# Patient Record
Sex: Male | Born: 1985 | Race: Black or African American | Hispanic: No | Marital: Single | Smoking: Never smoker
Health system: Southern US, Community
[De-identification: ages and names within clinical notes are randomized; demographics above are authoritative.]

---

## 2014-04-16 ENCOUNTER — Encounter (HOSPITAL_BASED_OUTPATIENT_CLINIC_OR_DEPARTMENT_OTHER): Payer: Self-pay | Admitting: Emergency Medicine

## 2014-04-16 ENCOUNTER — Inpatient Hospital Stay (HOSPITAL_BASED_OUTPATIENT_CLINIC_OR_DEPARTMENT_OTHER)
Admission: EM | Admit: 2014-04-16 | Discharge: 2014-04-23 | DRG: 558 | Disposition: A | Discharge status: Home / Self Care | Payer: BC Managed Care – PPO | Attending: Internal Medicine | Admitting: Internal Medicine

## 2014-04-16 DIAGNOSIS — R748 Abnormal levels of other serum enzymes: Secondary | ICD-10-CM | POA: Diagnosis present

## 2014-04-16 DIAGNOSIS — M6282 Rhabdomyolysis: Principal | ICD-10-CM | POA: Diagnosis present

## 2014-04-16 DIAGNOSIS — F17200 Nicotine dependence, unspecified, uncomplicated: Secondary | ICD-10-CM | POA: Diagnosis present

## 2014-04-16 DIAGNOSIS — F1910 Other psychoactive substance abuse, uncomplicated: Secondary | ICD-10-CM | POA: Diagnosis present

## 2014-04-16 DIAGNOSIS — M791 Myalgia, unspecified site: Secondary | ICD-10-CM

## 2014-04-16 LAB — CBC WITH DIFFERENTIAL/PLATELET
Basophils Absolute: 0 10*3/uL (ref 0.0–0.1)
Basophils Relative: 0 % (ref 0–1)
Eosinophils Absolute: 0.1 10*3/uL (ref 0.0–0.7)
Eosinophils Relative: 1 % (ref 0–5)
HCT: 45.1 % (ref 39.0–52.0)
Hemoglobin: 15.5 g/dL (ref 13.0–17.0)
LYMPHS ABS: 2.2 10*3/uL (ref 0.7–4.0)
Lymphocytes Relative: 25 % (ref 12–46)
MCH: 28.5 pg (ref 26.0–34.0)
MCHC: 34.4 g/dL (ref 30.0–36.0)
MCV: 82.9 fL (ref 78.0–100.0)
MONOS PCT: 7 % (ref 3–12)
Monocytes Absolute: 0.6 10*3/uL (ref 0.1–1.0)
NEUTROS PCT: 67 % (ref 43–77)
Neutro Abs: 5.8 10*3/uL (ref 1.7–7.7)
Platelets: 218 10*3/uL (ref 150–400)
RBC: 5.44 MIL/uL (ref 4.22–5.81)
RDW: 13.7 % (ref 11.5–15.5)
WBC: 8.6 10*3/uL (ref 4.0–10.5)

## 2014-04-16 LAB — URINALYSIS, ROUTINE W REFLEX MICROSCOPIC
Glucose, UA: NEGATIVE mg/dL
Ketones, ur: 15 mg/dL — AB
NITRITE: NEGATIVE
PROTEIN: 100 mg/dL — AB
SPECIFIC GRAVITY, URINE: 1.021 (ref 1.005–1.030)
Urobilinogen, UA: 1 mg/dL (ref 0.0–1.0)
pH: 6 (ref 5.0–8.0)

## 2014-04-16 LAB — COMPREHENSIVE METABOLIC PANEL
ALK PHOS: 78 U/L (ref 39–117)
ALT: 273 U/L — ABNORMAL HIGH (ref 0–53)
AST: 1193 U/L — ABNORMAL HIGH (ref 0–37)
Albumin: 4.2 g/dL (ref 3.5–5.2)
BILIRUBIN TOTAL: 0.8 mg/dL (ref 0.3–1.2)
BUN: 14 mg/dL (ref 6–23)
CO2: 28 mEq/L (ref 19–32)
CREATININE: 1.1 mg/dL (ref 0.50–1.35)
Calcium: 9.7 mg/dL (ref 8.4–10.5)
Chloride: 101 mEq/L (ref 96–112)
GFR calc non Af Amer: >90 mL/min (ref >90)
GLUCOSE: 92 mg/dL (ref 70–99)
POTASSIUM: 4.1 meq/L (ref 3.7–5.3)
Sodium: 141 mEq/L (ref 137–147)
TOTAL PROTEIN: 7.8 g/dL (ref 6.0–8.3)

## 2014-04-16 LAB — URINE MICROSCOPIC-ADD ON

## 2014-04-16 LAB — CK

## 2014-04-16 MED ORDER — SODIUM CHLORIDE 0.9 % IV BOLUS (SEPSIS)
1000.0000 mL | Freq: Once | INTRAVENOUS | Status: AC
  Administered 2014-04-16: 1000 mL via INTRAVENOUS

## 2014-04-16 MED ORDER — FENTANYL CITRATE 0.05 MG/ML IJ SOLN
50.0000 ug | Freq: Once | INTRAMUSCULAR | Status: AC
  Administered 2014-04-16: 50 ug via INTRAVENOUS
  Filled 2014-04-16: qty 2

## 2014-04-16 MED ORDER — SODIUM CHLORIDE 0.9 % IV SOLN
INTRAVENOUS | Status: DC
  Administered 2014-04-16: 1000 mL via INTRAVENOUS
  Administered 2014-04-20 – 2014-04-22 (×7): via INTRAVENOUS

## 2014-04-16 MED ORDER — HYDROMORPHONE HCL PF 1 MG/ML IJ SOLN
1.0000 mg | Freq: Once | INTRAMUSCULAR | Status: AC
  Administered 2014-04-16: 1 mg via INTRAVENOUS
  Filled 2014-04-16: qty 1

## 2014-04-16 NOTE — Evaluation (Addendum)
Received call about patient with exercise-induced rhabdomyolysis. Patient is otherwise excised nave and decided to do an extremely vigorous upper body workout yesterday. Patient developed some severe body aches as well as some brown urine. Presented to [facility] with a CK level of greater than 50,000, AST of 1200, ALT 273, + hgb/myoglobin in urine. HAs received bolus x 2. Asking for medical admission. No other medical issues. VSS.

## 2014-04-16 NOTE — ED Notes (Signed)
Pt states worked out Monday and having upper chest, shoulders and arm pain,  His complaint is dark urined  Denies flank pain or "internal"pain

## 2014-04-16 NOTE — ED Provider Notes (Signed)
CSN: [id number]     Arrival date & time 04/16/14  2008 History  This chart was scribed for Charles B. Bernette MayersSheldon, MD by Dorothey Basemania Sutton, ED Scribe. This patient was seen in room MH02/MH02 and the patient's care was started at 8:46 PM.    Chief Complaint  Patient presents with  . brown urine    The history is provided by the patient. No language interpreter was used.   HPI Comments: Benjamin Garrett is a 28 y.o. male who presents to the Emergency Department complaining of dark, brown/tea-colored urine with foam/bubbles in it with associated decreased urine volume (only one void today) onset earlier today. He states his last normal void yesterday. He reports myalgias to the abdomen and upper extremities, but states that he exercised these areas heavily 2 days ago. He states that he usually exercises regularly, but his current pain is more persistent and severe than usual. Patient denies dysuria, vomiting, fever, unusual swelling. He denies history of blood pressure problems. He reports that he took creatine (one scoop per bottle) 2 days ago, but denies other medication or supplement use. Patient is a current every day, light smoker and an occasional, social drinker. He denies other illicit drug use. Patient has no other pertinent medical history.  History reviewed. No pertinent past medical history. History reviewed. No pertinent past surgical history. History reviewed. No pertinent family history. History  Substance Use Topics  . Smoking status: Current Every Day Smoker -- 0.50 packs/day    Types: Cigarettes  . Smokeless tobacco: Not on file  . Alcohol Use: No    Review of Systems  A complete 10 system review of systems was obtained and all systems are negative except as noted in the HPI and PMH.    Allergies  Review of patient's allergies indicates no known allergies.  Home Medications   Prior to Admission medications   Not on File   Triage Vitals: BP 142/91  Pulse 88  Temp(Src) 98.3 F  (36.8 C)  Resp 16  Ht 5\' 9"  (1.753 m)  Wt 185 lb (83.915 kg)  BMI 27.31 kg/m2  SpO2 100%  Physical Exam  Nursing note and vitals reviewed. Constitutional: He is oriented to person, place, and time. He appears well-developed and well-nourished.  HENT:  Head: Normocephalic and atraumatic.  Eyes: EOM are normal. Pupils are equal, round, and reactive to light.  Neck: Normal range of motion. Neck supple.  Cardiovascular: Normal rate, normal heart sounds and intact distal pulses.   Pulmonary/Chest: Effort normal and breath sounds normal.  Abdominal: Bowel sounds are normal. He exhibits no distension. There is no tenderness.  Musculoskeletal: Normal range of motion. He exhibits no edema and no tenderness.  Diffuse muscle soreness to upper body.   Neurological: He is alert and oriented to person, place, and time. He has normal strength. No cranial nerve deficit or sensory deficit.  Skin: Skin is warm and dry. No rash noted.  Psychiatric: He has a normal mood and affect.    ED Course  Procedures (including critical care time)  DIAGNOSTIC STUDIES: Oxygen Saturation is 100% on room air, normal by my interpretation.    COORDINATION OF CARE: 8:15 PM- Ordered UA and urine microscopic.   8:52 PM- Discussed that symptoms are due to a problem with the kidneys filtering out myoglobin. Discussed that the patient will likely need to be admitted to the hospital for further care. Ordered CK, serum myoglobin, CBC, and CMP. Ordered IV fluids and fentanyl to manage symptoms. Discussed  treatment plan with patient at bedside and patient verbalized agreement.     Labs Review Labs Reviewed  URINALYSIS, ROUTINE W REFLEX MICROSCOPIC - Abnormal; Notable for the following:    Color, Urine RED (*)    APPearance CLOUDY (*)    Hgb urine dipstick LARGE (*)    Bilirubin Urine MODERATE (*)    Ketones, ur 15 (*)    Protein, ur 100 (*)    Leukocytes, UA SMALL (*)    All other components within normal limits   CK - Abnormal; Notable for the following:    Total CK >50000 (*)    All other components within normal limits  COMPREHENSIVE METABOLIC PANEL - Abnormal; Notable for the following:    AST 1193 (*)    ALT 273 (*)    All other components within normal limits  URINE MICROSCOPIC-ADD ON  CBC WITH DIFFERENTIAL  MYOGLOBIN, SERUM    Imaging Review No results found.   EKG Interpretation None      MDM   Final diagnoses:  Rhabdomyolysis    Pt with labs and symptoms consistent with exercise induced rhabdomyolysis. No signs of renal failure at this time, admit for aggressive hydration.   I personally performed the services described in this documentation, which was scribed in my presence. The recorded information has been reviewed and is accurate.       Charles B. Bernette MayersSheldon, MD 04/16/14 2226

## 2014-04-16 NOTE — ED Notes (Signed)
Pt c/o generalized " achy" with dark brown urine x 1 day

## 2014-04-17 ENCOUNTER — Encounter (HOSPITAL_COMMUNITY): Payer: Self-pay

## 2014-04-17 DIAGNOSIS — M6282 Rhabdomyolysis: Principal | ICD-10-CM

## 2014-04-17 LAB — CBC
HCT: 40.8 % (ref 39.0–52.0)
Hemoglobin: 13.4 g/dL (ref 13.0–17.0)
MCH: 27.6 pg (ref 26.0–34.0)
MCHC: 32.8 g/dL (ref 30.0–36.0)
MCV: 84.1 fL (ref 78.0–100.0)
PLATELETS: 200 10*3/uL (ref 150–400)
RBC: 4.85 MIL/uL (ref 4.22–5.81)
RDW: 14 % (ref 11.5–15.5)
WBC: 6.7 10*3/uL (ref 4.0–10.5)

## 2014-04-17 LAB — CK
Total CK: >50000 U/L — ABNORMAL HIGH (ref 7–232)
Total CK: >50000 U/L — ABNORMAL HIGH (ref 7–232)
Total CK: >50000 U/L — ABNORMAL HIGH (ref 7–232)

## 2014-04-17 LAB — COMPREHENSIVE METABOLIC PANEL
ALT: 264 U/L — ABNORMAL HIGH (ref 0–53)
AST: 1161 U/L — ABNORMAL HIGH (ref 0–37)
Albumin: 3 g/dL — ABNORMAL LOW (ref 3.5–5.2)
Alkaline Phosphatase: 62 U/L (ref 39–117)
BUN: 12 mg/dL (ref 6–23)
CO2: 26 mEq/L (ref 19–32)
Calcium: 8.4 mg/dL (ref 8.4–10.5)
Chloride: 106 mEq/L (ref 96–112)
Creatinine, Ser: 1.01 mg/dL (ref 0.50–1.35)
GFR calc Af Amer: >90 mL/min (ref >90)
GFR calc non Af Amer: >90 mL/min (ref >90)
Glucose, Bld: 112 mg/dL — ABNORMAL HIGH (ref 70–99)
Potassium: 4.1 mEq/L (ref 3.7–5.3)
Sodium: 142 mEq/L (ref 137–147)
Total Bilirubin: 0.5 mg/dL (ref 0.3–1.2)
Total Protein: 5.8 g/dL — ABNORMAL LOW (ref 6.0–8.3)

## 2014-04-17 LAB — MYOGLOBIN, SERUM: Myoglobin: >20000 ng/mL — ABNORMAL HIGH (ref <111)

## 2014-04-17 MED ORDER — OXYCODONE-ACETAMINOPHEN 5-325 MG PO TABS
1.0000 | ORAL_TABLET | ORAL | Status: DC | PRN
  Administered 2014-04-17: 1 via ORAL
  Filled 2014-04-17: qty 1

## 2014-04-17 MED ORDER — OXYCODONE-ACETAMINOPHEN 5-325 MG PO TABS
2.0000 | ORAL_TABLET | Freq: Once | ORAL | Status: AC
  Administered 2014-04-17: 2 via ORAL
  Filled 2014-04-17: qty 2

## 2014-04-17 MED ORDER — HEPARIN SODIUM (PORCINE) 5000 UNIT/ML IJ SOLN
5000.0000 [IU] | Freq: Three times a day (TID) | INTRAMUSCULAR | Status: DC
  Administered 2014-04-17 – 2014-04-22 (×15): 5000 [IU] via SUBCUTANEOUS
  Filled 2014-04-17 (×23): qty 1

## 2014-04-17 MED ORDER — SODIUM CHLORIDE 0.9 % IV SOLN
INTRAVENOUS | Status: DC
  Administered 2014-04-17 – 2014-04-23 (×20): via INTRAVENOUS

## 2014-04-17 MED ORDER — OXYCODONE HCL 5 MG PO TABS
5.0000 mg | ORAL_TABLET | ORAL | Status: DC | PRN
  Administered 2014-04-17 (×3): 5 mg via ORAL
  Filled 2014-04-17 (×3): qty 1

## 2014-04-17 MED ORDER — SODIUM CHLORIDE 0.9 % IV SOLN: INTRAVENOUS | Status: AC

## 2014-04-17 MED ORDER — HEPARIN SODIUM (PORCINE) 5000 UNIT/ML IJ SOLN: 5000.0000 [IU] | Freq: Three times a day (TID) | INTRAMUSCULAR | Status: DC

## 2014-04-17 NOTE — Progress Notes (Signed)
UR completed. Patient changed to inpatient- ck >50,000

## 2014-04-17 NOTE — H&P (Addendum)
Hospitalist Admission History and Physical  Patient name: Benjamin Garrett Medical record number: [id number] Date of birth: 1986/06/07 Age: 28 y.o. Gender: male  Primary Care Provider: No PCP Per Patient  Chief Complaint: rhabdomyolysis  History of Present Illness:This is a 28 y.o. year old male with bo significant medical history presenting with exercise induced rhabdomyolysis. Pt states that he has not exercised in over 6 months. States that he did a very strenuous upper body work on Monday. Pt states that he felt otherwise fine on Tuesday with general soreness, but woke up today feeling like "I got hit by a bus". Pt went to work throughout the day. Works a Office managerdesk job. Did not urinate during the day. Pt urinated when he went home and it was dark brown. Pt denies any illicit drug. Use.Has not performed any other strenous activity apart from exercise on Monday.  [facility]k. LFTs also elevated. AST 1193, ALT 273. Cr 1.1. UA with large blood. [facility]   Patient Active Problem List   Diagnosis Date Noted  . Rhabdomyolysis 04/16/2014   Past Medical History: History reviewed. No pertinent past medical history.  Past Surgical History: History reviewed. No pertinent past surgical history.  Social History: History   Social History  . Marital Status: Single    Spouse Name: N/A    Number of Children: N/A  . Years of Education: N/A   Social History Main Topics  . Smoking status: Current Every Day Smoker -- 0.50 packs/day    Types: Cigarettes  . Smokeless tobacco: None  . Alcohol Use: No  . Drug Use: No  . Sexual Activity: No   Other Topics Concern  . None   Social History Narrative  . None    Family History: History reviewed. No pertinent family history.  Allergies: No Known Allergies  Current Facility-Administered Medications  Medication Dose Route Frequency Provider Last Rate Last Dose  . 0.9 %  sodium chloride infusion    Intravenous Continuous Charles B. Bernette MayersSheldon, MD 250 mL/hr at 04/16/14 2252 1,000 mL at 04/16/14 2252  . 0.9 %  sodium chloride infusion   Intravenous STAT Charles B. Bernette MayersSheldon, MD      . 0.9 %  sodium chloride infusion   Intravenous Continuous Doree AlbeeSteven Loralye Loberg, MD      . heparin injection 5,000 Units  5,000 Units Subcutaneous 3 times per day Doree AlbeeSteven Laural Eiland, MD       Review Of Systems: 12 point ROS negative except as noted above in HPI.  Physical Exam: Filed Vitals:   04/17/14 0034  BP: 139/93  Pulse: 61  Temp: 98.5 F (36.9 C)  Resp: 18    General: alert and cooperative HEENT: PERRLA and extra ocular movement intact Heart: S1, S2 normal, no murmur, rub or gallop, regular rate and rhythm Lungs: clear to auscultation, no wheezes or rales and unlabored breathing Abdomen: abdomen is soft without significant tenderness, masses, organomegaly or guarding Extremities: extremities normal, atraumatic, no cyanosis or edema and minimal to mild UE tenderness Skin:no rashes, no ecchymoses Neurology: normal without focal findings, mental status, speech normal, alert and oriented x3, PERLA and reflexes normal and symmetric  Labs and Imaging: Lab Results  Component Value Date/Time   NA 141 04/16/2014  9:14 PM   K 4.1 04/16/2014  9:14 PM   CL 101 04/16/2014  9:14 PM   CO2 28 04/16/2014  9:14 PM   BUN 14 04/16/2014  9:14 PM  CREATININE 1.10 04/16/2014  9:14 PM   GLUCOSE 92 04/16/2014  9:14 PM   Lab Results  Component Value Date   WBC 8.6 04/16/2014   HGB 15.5 04/16/2014   HCT 45.1 04/16/2014   MCV 82.9 04/16/2014   PLT 218 04/16/2014   Urinalysis    Component Value Date/Time   COLORURINE RED* 04/16/2014 2014   APPEARANCEUR CLOUDY* 04/16/2014 2014   LABSPEC 1.021 04/16/2014 2014   PHURINE 6.0 04/16/2014 2014   GLUCOSEU NEGATIVE 04/16/2014 2014   HGBUR LARGE* 04/16/2014 2014   BILIRUBINUR MODERATE* 04/16/2014 2014   KETONESUR 15* 04/16/2014 2014   PROTEINUR 100* 04/16/2014 2014   UROBILINOGEN 1.0 04/16/2014  2014   NITRITE NEGATIVE 04/16/2014 2014   LEUKOCYTESUR SMALL* 04/16/2014 2014       No results found.   Assessment and Plan: Benjamin Sanfilipponthony Nikolai is a 28 y.o. year old male presenting with rhabdomyolysis   Rhabdomyolysis: Aggressive fluid hydration. NS @ 250 ccs/hr. No renal involvement currently. Serial CKs. Recheck UA in 12-24 hours. Trend myoglobinuria. Strict Is and Os.   Transaminitis: Likely secondary to rhabdomyolysis. Will trend. Consider RUQ u/s, hepatits panel,  if LFTs persist despite hydration.   FEN/GI: regular diet.  Prophylaxis: SCDs Disposition: pending further evaluation  Code Status:Full Code        Doree AlbeeSteven Chicquita Mendel MD  Pager: [phone]

## 2014-04-17 NOTE — Progress Notes (Signed)
[facility] PROGRESS NOTE  Benjamin Garrett ZOX:[id number] DOB: 1985/12/15 DOA: 04/16/2014 PCP: No PCP Per Patient  Assessment/Plan: 1. 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 CK remains at >50000 2. Renal function remains unchanged 3. LFT's slowly trending down 4. Cont aggressive IVF for now 5. Follow renal fx and serial CK trends 2. DVT prophylaxis 1. Heparin subQ  Code Status: Full Family Communication: Pt and family in room Disposition Plan: Pending  Consultants:    Procedures:    Antibiotics:   (indicate start date, and stop date if known)  HPI/Subjective: Feels somewhat better today. Arms and shoulder still hurts   Objective: Filed Vitals:   04/16/14 2014 04/16/14 2302 04/17/14 0034 04/17/14 0602  BP: 142/91 134/84 139/93 134/81  Pulse: 88 66 61 65  Temp: 98.3 F (36.8 C) 98.7 F (37.1 C) 98.5 F (36.9 C) 97.4 F (36.3 C)  TempSrc:  Oral Oral   Resp: 16 16 18 18   Height: 5\' 9"  (1.753 m)  5\' 9"  (1.753 m)   Weight: 83.915 kg (185 lb)  84.369 kg (186 lb)   SpO2: 100% 100% 100% 100%   No intake or output data in the 24 hours ending 04/17/14 0818 Filed Weights   04/16/14 2014 04/17/14 0034  Weight: 83.915 kg (185 lb) 84.369 kg (186 lb)    Exam:   General:  Awake, in nad  Cardiovascular: regular, s1, s2  Respiratory: normal resp effort, no wheezing  Abdomen: soft, nondistended  Musculoskeletal: B upper extremity pain on palpation  Data Reviewed: Basic Metabolic Panel:  Recent Labs Lab 04/16/14 2114 04/17/14 0429  NA 141 142  K 4.1 4.1  CL 101 106  CO2 28 26  GLUCOSE 92 112*  BUN 14 12  CREATININE 1.10 1.01  CALCIUM 9.7 8.4   Liver Function Tests:  Recent Labs Lab 04/16/14 2114 04/17/14 0429  AST 1193* 1161*  ALT 273* 264*  ALKPHOS 78 62  BILITOT 0.8 0.5  PROT 7.8 5.8*  ALBUMIN 4.2 3.0*   No results found for this basename: LIPASE, AMYLASE,  in the last 168 hours No results found for this  basename: AMMONIA,  in the last 168 hours CBC:  Recent Labs Lab 04/16/14 2114 04/17/14 0429  WBC 8.6 6.7  NEUTROABS 5.8  --   HGB 15.5 13.4  HCT 45.1 40.8  MCV 82.9 84.1  PLT 218 200   Cardiac Enzymes:  Recent Labs Lab 04/16/14 2114 04/17/14 0429  CKTOTAL >50000* >50000*   BNP (last 3 results) No results found for this basename: PROBNP,  in the last 8760 hours CBG: No results found for this basename: GLUCAP,  in the last 168 hours  No results found for this or any previous visit (from the past 240 hour(s)).   Studies: No results found.  Scheduled Meds: . sodium chloride   Intravenous STAT   Continuous Infusions: . sodium chloride 1,000 mL (04/16/14 2252)  . sodium chloride 250 mL/hr at 04/17/14 0240    Active Problems:   Rhabdomyolysis  Time spent: 35min  Jerald KiefStephen K Antha Niday  [facility] Pager [phone]. If 7PM-7AM, please contact night-coverage at [email or web address], password [email or web address] 04/17/2014, 8:18 AM  LOS: 1 day

## 2014-04-18 DIAGNOSIS — IMO0001 Reserved for inherently not codable concepts without codable children: Secondary | ICD-10-CM | POA: Diagnosis not present

## 2014-04-18 DIAGNOSIS — R748 Abnormal levels of other serum enzymes: Secondary | ICD-10-CM | POA: Diagnosis not present

## 2014-04-18 DIAGNOSIS — M6282 Rhabdomyolysis: Secondary | ICD-10-CM | POA: Diagnosis not present

## 2014-04-18 LAB — URINE MICROSCOPIC-ADD ON

## 2014-04-18 LAB — CK
Total CK: >50000 U/L — ABNORMAL HIGH (ref 7–232)
Total CK: >50000 U/L — ABNORMAL HIGH (ref 7–232)
Total CK: >50000 U/L — ABNORMAL HIGH (ref 7–232)

## 2014-04-18 LAB — COMPREHENSIVE METABOLIC PANEL
ALK PHOS: 66 U/L (ref 39–117)
ALT: 424 U/L — AB (ref 0–53)
AST: 1715 U/L — ABNORMAL HIGH (ref 0–37)
Albumin: 3.1 g/dL — ABNORMAL LOW (ref 3.5–5.2)
BUN: 7 mg/dL (ref 6–23)
CHLORIDE: 102 meq/L (ref 96–112)
CO2: 28 meq/L (ref 19–32)
Calcium: 8.4 mg/dL (ref 8.4–10.5)
Creatinine, Ser: 0.99 mg/dL (ref 0.50–1.35)
GLUCOSE: 87 mg/dL (ref 70–99)
POTASSIUM: 3.9 meq/L (ref 3.7–5.3)
SODIUM: 138 meq/L (ref 137–147)
Total Bilirubin: 0.6 mg/dL (ref 0.3–1.2)
Total Protein: 6.2 g/dL (ref 6.0–8.3)

## 2014-04-18 LAB — URINALYSIS, ROUTINE W REFLEX MICROSCOPIC
Bilirubin Urine: NEGATIVE
GLUCOSE, UA: NEGATIVE mg/dL
Ketones, ur: 15 mg/dL — AB
LEUKOCYTES UA: NEGATIVE
Nitrite: NEGATIVE
PH: 6.5 (ref 5.0–8.0)
Protein, ur: 30 mg/dL — AB
SPECIFIC GRAVITY, URINE: 1.008 (ref 1.005–1.030)
Urobilinogen, UA: 0.2 mg/dL (ref 0.0–1.0)

## 2014-04-18 MED ORDER — METHOCARBAMOL 500 MG PO TABS
500.0000 mg | ORAL_TABLET | Freq: Four times a day (QID) | ORAL | Status: DC | PRN
  Administered 2014-04-18 – 2014-04-21 (×4): 500 mg via ORAL
  Filled 2014-04-18 (×5): qty 1

## 2014-04-18 MED ORDER — HYDROMORPHONE HCL PF 1 MG/ML IJ SOLN
1.0000 mg | INTRAMUSCULAR | Status: DC | PRN
  Administered 2014-04-18 – 2014-04-22 (×9): 1 mg via INTRAVENOUS
  Filled 2014-04-18 (×12): qty 1

## 2014-04-18 MED ORDER — HYDROCODONE-ACETAMINOPHEN 5-325 MG PO TABS
1.0000 | ORAL_TABLET | ORAL | Status: DC | PRN
  Administered 2014-04-18: 1 via ORAL
  Filled 2014-04-18 (×2): qty 1

## 2014-04-18 MED ORDER — METHOCARBAMOL 500 MG PO TABS
500.0000 mg | ORAL_TABLET | Freq: Once | ORAL | Status: AC
  Administered 2014-04-18: 500 mg via ORAL
  Filled 2014-04-18 (×2): qty 1

## 2014-04-18 NOTE — Progress Notes (Signed)
[facility] PROGRESS NOTE  Benjamin Garrett [id number] DOB: 09/02/1986 DOA: 04/16/2014 PCP: No PCP Per Patient  Assessment/Plan: 1. 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 CK remains at >50000 2. Renal function remains unchanged and normal 3. LFT's trending up this AM - pt did receive acetaminophen containing pain meds, which have now been d/c'd 4. Cont aggressive IVF 5. Follow renal fx and serial CK trends 2. DVT prophylaxis 1. Heparin subQ  Code Status: Full Family Communication: Pt and family in room Disposition Plan: Pending  Consultants:    Procedures:    Antibiotics:    HPI/Subjective: Reports much improved muscle soreness   Objective: Filed Vitals:   04/17/14 0602 04/17/14 1345 04/17/14 2227 04/18/14 0443  BP: 134/81 140/65 148/99 145/88  Pulse: 65 74 64 74  Temp: 97.4 F (36.3 C) 98 F (36.7 C) 98 F (36.7 C) 98.3 F (36.8 C)  TempSrc:  Oral Oral Oral  Resp: 18 18 18 18   Height:      Weight:    84.959 kg (187 lb 4.8 oz)  SpO2: 100% 100% 100% 98%    Intake/Output Summary (Last 24 hours) at 04/18/14 0812 Last data filed at 04/18/14 0700  Gross per 24 hour  Intake 9944.17 ml  Output   5900 ml  Net 4044.17 ml   Filed Weights   04/16/14 2014 04/17/14 0034 04/18/14 0443  Weight: 83.915 kg (185 lb) 84.369 kg (186 lb) 84.959 kg (187 lb 4.8 oz)    Exam:   General:  Awake, in nad  Cardiovascular: regular, s1, s2  Respiratory: normal resp effort, no wheezing  Abdomen: soft, nondistended  Musculoskeletal: B upper extremity pain on palpation  Data Reviewed: Basic Metabolic Panel:  Recent Labs Lab 04/16/14 2114 04/17/14 0429 04/18/14 0035  NA 141 142 138  K 4.1 4.1 3.9  CL 101 106 102  CO2 28 26 28   GLUCOSE 92 112* 87  BUN 14 12 7   CREATININE 1.10 1.01 0.99  CALCIUM 9.7 8.4 8.4   Liver Function Tests:  Recent Labs Lab 04/16/14 2114 04/17/14 0429 04/18/14 0035  AST 1193* 1161* 1715*  ALT  273* 264* 424*  ALKPHOS 78 62 66  BILITOT 0.8 0.5 0.6  PROT 7.8 5.8* 6.2  ALBUMIN 4.2 3.0* 3.1*   No results found for this basename: LIPASE, AMYLASE,  in the last 168 hours No results found for this basename: AMMONIA,  in the last 168 hours CBC:  Recent Labs Lab 04/16/14 2114 04/17/14 0429  WBC 8.6 6.7  NEUTROABS 5.8  --   HGB 15.5 13.4  HCT 45.1 40.8  MCV 82.9 84.1  PLT 218 200   Cardiac Enzymes:  Recent Labs Lab 04/16/14 2114 04/17/14 0429 04/17/14 0951 04/17/14 1708 04/18/14 0035  CKTOTAL >50000* >50000* >50000* >50000* >50000*   BNP (last 3 results) No results found for this basename: PROBNP,  in the last 8760 hours CBG: No results found for this basename: GLUCAP,  in the last 168 hours  No results found for this or any previous visit (from the past 240 hour(s)).   Studies: No results found.  Scheduled Meds: . heparin subcutaneous  5,000 Units Subcutaneous 3 times per day   Continuous Infusions: . sodium chloride 1,000 mL (04/16/14 2252)  . sodium chloride 250 mL/hr at 04/17/14 2335    Active Problems:   Rhabdomyolysis  Time spent: 35min  Benjamin Garrett  [facility] Pager [phone]. If 7PM-7AM, please contact night-coverage at [email or web address], password [email or web address] 04/18/2014, 8:12 AM  LOS: 2 days

## 2014-04-19 ENCOUNTER — Inpatient Hospital Stay (HOSPITAL_COMMUNITY): Payer: BC Managed Care – PPO

## 2014-04-19 DIAGNOSIS — IMO0001 Reserved for inherently not codable concepts without codable children: Secondary | ICD-10-CM | POA: Diagnosis not present

## 2014-04-19 DIAGNOSIS — R748 Abnormal levels of other serum enzymes: Secondary | ICD-10-CM | POA: Diagnosis not present

## 2014-04-19 DIAGNOSIS — M6282 Rhabdomyolysis: Secondary | ICD-10-CM | POA: Diagnosis not present

## 2014-04-19 LAB — COMPREHENSIVE METABOLIC PANEL
ALBUMIN: 3.3 g/dL — AB (ref 3.5–5.2)
ALT: 556 U/L — AB (ref 0–53)
AST: 2145 U/L — ABNORMAL HIGH (ref 0–37)
Alkaline Phosphatase: 69 U/L (ref 39–117)
BILIRUBIN TOTAL: 0.5 mg/dL (ref 0.3–1.2)
BUN: 7 mg/dL (ref 6–23)
CHLORIDE: 102 meq/L (ref 96–112)
CO2: 27 mEq/L (ref 19–32)
Calcium: 8.9 mg/dL (ref 8.4–10.5)
Creatinine, Ser: 0.85 mg/dL (ref 0.50–1.35)
GFR calc Af Amer: >90 mL/min (ref >90)
GFR calc non Af Amer: >90 mL/min (ref >90)
GLUCOSE: 92 mg/dL (ref 70–99)
POTASSIUM: 4.2 meq/L (ref 3.7–5.3)
Sodium: 140 mEq/L (ref 137–147)
TOTAL PROTEIN: 6.8 g/dL (ref 6.0–8.3)

## 2014-04-19 LAB — SEDIMENTATION RATE: Sed Rate: 4 mm/hr (ref 0–16)

## 2014-04-19 LAB — RAPID URINE DRUG SCREEN, HOSP PERFORMED
Amphetamines: NOT DETECTED
Barbiturates: NOT DETECTED
Benzodiazepines: NOT DETECTED
Cocaine: NOT DETECTED
Opiates: NOT DETECTED
Tetrahydrocannabinol: POSITIVE — AB

## 2014-04-19 LAB — LACTIC ACID, PLASMA: Lactic Acid, Venous: 1.1 mmol/L (ref 0.5–2.2)

## 2014-04-19 LAB — LACTATE DEHYDROGENASE: LDH: 3630 U/L — ABNORMAL HIGH (ref 94–250)

## 2014-04-19 LAB — URIC ACID: Uric Acid, Serum: 3.8 mg/dL — ABNORMAL LOW (ref 4.0–7.8)

## 2014-04-19 LAB — CK: Total CK: >50000 U/L — ABNORMAL HIGH (ref 7–232)

## 2014-04-19 MED ORDER — SODIUM CHLORIDE 0.9 % IV BOLUS (SEPSIS)
250.0000 mL | Freq: Once | INTRAVENOUS | Status: AC
  Administered 2014-04-19: 250 mL via INTRAVENOUS

## 2014-04-19 NOTE — Progress Notes (Signed)
[facility] PROGRESS NOTE  Benjamin Garrett [id number]:[id number] DOB: 12-Oct-1986 DOA: 04/16/2014 PCP: No PCP Per Patient  Assessment/Plan: 1. 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 CK remains at >50000 despite aggressive ivf 2. Renal function remains unchanged and normal 3. LFT's continuing to trend up - acetaminophen containing pain meds have now been d/c'd (see below) 4. Cont aggressive IVF 5. Cont to follow renal fx and serial CK trends 6. Given degree of CK, will check urine drug screen 7. Pt did admit to taking pre-workout supplement x 2 days before onset of sx 8. Will check lactate, pyruvate, LDH, ESR, carnitine, ANA to r/o underlying inherited myopathy 2. Elevated Liver Enzymes 1. Suspect secondary to presenting rhabdo 2. Rising despite aggressive fluids 3. Will check liver US 3. DVT prophylaxis 1. Heparin subQ  Code Status: Full Family Communication: Pt in room Disposition Plan: Pending  Consultants:    Procedures:    Antibiotics:    HPI/Subjective: Reports continued improvement   Objective: Filed Vitals:   04/18/14 0443 04/18/14 1525 04/18/14 2107 04/19/14 0610  BP: 145/88 155/90 141/98 136/90  Pulse: 74 71 69 67  Temp: 98.3 F (36.8 C) 98 F (36.7 C) 98.9 F (37.2 C) 97.9 F (36.6 C)  TempSrc: Oral Oral Oral Oral  Resp: '18 19 18 17  ' Height:      Weight: 84.959 kg (187 lb 4.8 oz)   83.416 kg (183 lb 14.4 oz)  SpO2: 98% 99% 100% 100%    Intake/Output Summary (Last 24 hours) at 04/19/14 0847 Last data filed at 04/19/14 8832  Gross per 24 hour  Intake   3825 ml  Output   2350 ml  Net   1475 ml   Filed Weights   04/17/14 0034 04/18/14 0443 04/19/14 0610  Weight: 84.369 kg (186 lb) 84.959 kg (187 lb 4.8 oz) 83.416 kg (183 lb 14.4 oz)    Exam:   General:  Awake, in nad  Cardiovascular: regular, s1, s2  Respiratory: normal resp effort, no wheezing  Abdomen: soft, nondistended  Musculoskeletal: B upper  extremity pain on palpation  Data Reviewed: Basic Metabolic Panel:  Recent Labs Lab 04/16/14 2114 04/17/14 0429 04/18/14 0035 04/19/14 0012  NA 141 142 138 140  K 4.1 4.1 3.9 4.2  CL 101 106 102 102  CO2 '28 26 28 27  ' GLUCOSE 92 112* 87 92  BUN '14 12 7 7  ' CREATININE 1.10 1.01 0.99 0.85  CALCIUM 9.7 8.4 8.4 8.9   Liver Function Tests:  Recent Labs Lab 04/16/14 2114 04/17/14 0429 04/18/14 0035 04/19/14 0012  AST 1193* 1161* 1715* 2145*  ALT 273* 264* 424* 556*  ALKPHOS 78 62 66 69  BILITOT 0.8 0.5 0.6 0.5  PROT 7.8 5.8* 6.2 6.8  ALBUMIN 4.2 3.0* 3.1* 3.3*   No results found for this basename: LIPASE, AMYLASE,  in the last 168 hours No results found for this basename: AMMONIA,  in the last 168 hours CBC:  Recent Labs Lab 04/16/14 2114 04/17/14 0429  WBC 8.6 6.7  NEUTROABS 5.8  --   HGB 15.5 13.4  HCT 45.1 40.8  MCV 82.9 84.1  PLT 218 200   Cardiac Enzymes:  Recent Labs Lab 04/17/14 1708 04/18/14 0035 04/18/14 0920 04/18/14 1646 04/19/14 0012  CKTOTAL >50000* >50000* >50000* >50000* >50000*   BNP (last 3 results) No results found for this basename: PROBNP,  in the last 8760 hours CBG: No results found for this basename: GLUCAP,  in the last 168 hours  No results found for this or any previous visit (from the past 240 hour(s)).   Studies: No results found.  Scheduled Meds: . heparin subcutaneous  5,000 Units Subcutaneous 3 times per day   Continuous Infusions: . sodium chloride 1,000 mL (04/16/14 2252)  . sodium chloride 250 mL/hr at 04/19/14 1683    Active Problems:   Rhabdomyolysis  Time spent: 62mn  Stephen K Chiu  [facility] Pager [phone] If 7PM-7AM, please contact night-coverage at [email or web address], password [facility]5/16/2015, 8:47 AM  LOS: 3 days

## 2014-04-20 DIAGNOSIS — IMO0001 Reserved for inherently not codable concepts without codable children: Secondary | ICD-10-CM | POA: Diagnosis not present

## 2014-04-20 DIAGNOSIS — R748 Abnormal levels of other serum enzymes: Secondary | ICD-10-CM | POA: Diagnosis not present

## 2014-04-20 DIAGNOSIS — M6282 Rhabdomyolysis: Secondary | ICD-10-CM | POA: Diagnosis not present

## 2014-04-20 LAB — COMPREHENSIVE METABOLIC PANEL
ALK PHOS: 71 U/L (ref 39–117)
ALT: 597 U/L — AB (ref 0–53)
AST: 1915 U/L — ABNORMAL HIGH (ref 0–37)
Albumin: 3.2 g/dL — ABNORMAL LOW (ref 3.5–5.2)
BUN: 6 mg/dL (ref 6–23)
CO2: 28 mEq/L (ref 19–32)
Calcium: 8.9 mg/dL (ref 8.4–10.5)
Chloride: 102 mEq/L (ref 96–112)
Creatinine, Ser: 0.84 mg/dL (ref 0.50–1.35)
GFR calc non Af Amer: >90 mL/min (ref >90)
Glucose, Bld: 94 mg/dL (ref 70–99)
Potassium: 4.3 mEq/L (ref 3.7–5.3)
SODIUM: 141 meq/L (ref 137–147)
Total Bilirubin: 0.6 mg/dL (ref 0.3–1.2)
Total Protein: 6.4 g/dL (ref 6.0–8.3)

## 2014-04-20 LAB — CK: Total CK: >50000 U/L — ABNORMAL HIGH (ref 7–232)

## 2014-04-20 MED ORDER — SODIUM CHLORIDE 0.9 % IV BOLUS (SEPSIS)
250.0000 mL | Freq: Once | INTRAVENOUS | Status: AC
  Administered 2014-04-20: 250 mL via INTRAVENOUS

## 2014-04-20 NOTE — Progress Notes (Signed)
[facility] PROGRESS NOTE  Benjamin Garrett [name] DOB: October 06, 1986 DOA: 04/16/2014 PCP: No PCP Per Patient  Assessment/Plan: 1. 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 CK remains at >50000 despite aggressive ivf 2. Renal function remains unchanged and normal 3. AST trending down - acetaminophen containing pain meds have already been d/c'd (see below) 4. Cont aggressive IVF 5. Cont to follow renal fx and serial CK trends 6. Given degree of CK, urine drug screen was checked and was pos for cannabis 7. Pt did admit to taking pre-workout supplement x 2 days before onset of sx 8. Lactate,, LDH, ESR normal 9. Pyruvate and carnitine, ANA to r/o underlying inherited myopathy pending 2. Elevated Liver Enzymes 1. Suspect secondary to presenting rhabdo 2. AST trending down 3. Liver US nl 3. DVT prophylaxis 1. Heparin subQ  Code Status: Full Family Communication: Pt in room Disposition Plan: Pending  Consultants:    Procedures:    Antibiotics:    HPI/Subjective: Denies further pain. Reports UE soreness much improved. Reports urine appears clearer.   Objective: Filed Vitals:   04/19/14 0610 04/19/14 1355 04/19/14 2117 04/20/14 0439  BP: 136/90 142/87 139/81 123/75  Pulse: 67 53 79 63  Temp: 97.9 F (36.6 C) 97.9 F (36.6 C) 98.2 F (36.8 C) 97.6 F (36.4 C)  TempSrc: Oral Oral Oral Oral  Resp: '17 18 18 18  ' Height:      Weight: 83.416 kg (183 lb 14.4 oz)     SpO2: 100% 98% 99% 100%    Intake/Output Summary (Last 24 hours) at 04/20/14 0841 Last data filed at 04/20/14 0829  Gross per 24 hour  Intake   2443 ml  Output   3450 ml  Net  -1007 ml   Filed Weights   04/17/14 0034 04/18/14 0443 04/19/14 0610  Weight: 84.369 kg (186 lb) 84.959 kg (187 lb 4.8 oz) 83.416 kg (183 lb 14.4 oz)    Exam:   General:  Awake, in nad  Cardiovascular: regular, s1, s2  Respiratory: normal resp effort, no wheezing  Abdomen: soft,  nondistended  Musculoskeletal: B upper extremity pain on palpation  Data Reviewed: Basic Metabolic Panel:  Recent Labs Lab 04/16/14 2114 04/17/14 0429 04/18/14 0035 04/19/14 0012 04/20/14 0542  NA 141 142 138 140 141  K 4.1 4.1 3.9 4.2 4.3  CL 101 106 102 102 102  CO2 '28 26 28 27 28  ' GLUCOSE 92 112* 87 92 94  BUN '14 12 7 7 6  ' CREATININE 1.10 1.01 0.99 0.85 0.84  CALCIUM 9.7 8.4 8.4 8.9 8.9   Liver Function Tests:  Recent Labs Lab 04/16/14 2114 04/17/14 0429 04/18/14 0035 04/19/14 0012 04/20/14 0542  AST 1193* 1161* 1715* 2145* 1915*  ALT 273* 264* 424* 556* 597*  ALKPHOS 78 62 66 69 71  BILITOT 0.8 0.5 0.6 0.5 0.6  PROT 7.8 5.8* 6.2 6.8 6.4  ALBUMIN 4.2 3.0* 3.1* 3.3* 3.2*   No results found for this basename: LIPASE, AMYLASE,  in the last 168 hours No results found for this basename: AMMONIA,  in the last 168 hours CBC:  Recent Labs Lab 04/16/14 2114 04/17/14 0429  WBC 8.6 6.7  NEUTROABS 5.8  --   HGB 15.5 13.4  HCT 45.1 40.8  MCV 82.9 84.1  PLT 218 200   Cardiac Enzymes:  Recent Labs Lab 04/18/14 0035 04/18/14 0920 04/18/14 1646 04/19/14 0012 04/20/14 0542  CKTOTAL >50000* >50000* >50000* >50000* >50000*   BNP (last 3 results) No results found for  this basename: PROBNP,  in the last 8760 hours CBG: No results found for this basename: GLUCAP,  in the last 168 hours  No results found for this or any previous visit (from the past 240 hour(s)).   Studies: US Abdomen Limited Ruq  Apr 20, 2014   CLINICAL DATA:  Elevated liver enzymes  EXAM: US ABDOMEN LIMITED - RIGHT UPPER QUADRANT  COMPARISON:  None.  FINDINGS: Gallbladder:  No gallstones or wall thickening visualized. No sonographic Murphy sign noted.  Common bile duct:  Diameter: 5 mm  Liver:  No focal lesion identified. Within normal limits in parenchymal echogenicity.  IMPRESSION: Normal exam.   Electronically Signed   By: Kerby Moors M.D.   On: 2014-04-20 14:20    Scheduled Meds: .  heparin subcutaneous  5,000 Units Subcutaneous 3 times per day   Continuous Infusions: . sodium chloride 1,000 mL (04/16/14 2252)  . sodium chloride 250 mL/hr at Apr 20, 2014 2358    Active Problems:   Rhabdomyolysis  Time spent: 33mn  Stephen K Chiu  [facility] Pager [phone] If 7PM-7AM, please contact night-coverage at [email or web address], password [email or web address]5/17/2015, 8:41 AM  LOS: 4 days

## 2014-04-21 DIAGNOSIS — IMO0001 Reserved for inherently not codable concepts without codable children: Secondary | ICD-10-CM | POA: Diagnosis not present

## 2014-04-21 DIAGNOSIS — M6282 Rhabdomyolysis: Secondary | ICD-10-CM | POA: Diagnosis not present

## 2014-04-21 DIAGNOSIS — R748 Abnormal levels of other serum enzymes: Secondary | ICD-10-CM | POA: Diagnosis not present

## 2014-04-21 LAB — COMPREHENSIVE METABOLIC PANEL
ALBUMIN: 3.1 g/dL — AB (ref 3.5–5.2)
ALT: 600 U/L — ABNORMAL HIGH (ref 0–53)
AST: 1411 U/L — ABNORMAL HIGH (ref 0–37)
Alkaline Phosphatase: 76 U/L (ref 39–117)
BUN: 6 mg/dL (ref 6–23)
CO2: 29 mEq/L (ref 19–32)
CREATININE: 0.9 mg/dL (ref 0.50–1.35)
Calcium: 9.1 mg/dL (ref 8.4–10.5)
Chloride: 104 mEq/L (ref 96–112)
GFR calc Af Amer: >90 mL/min (ref >90)
GFR calc non Af Amer: >90 mL/min (ref >90)
Glucose, Bld: 82 mg/dL (ref 70–99)
POTASSIUM: 4.1 meq/L (ref 3.7–5.3)
Sodium: 142 mEq/L (ref 137–147)
TOTAL PROTEIN: 6.4 g/dL (ref 6.0–8.3)
Total Bilirubin: 0.4 mg/dL (ref 0.3–1.2)

## 2014-04-21 LAB — ANA: Anti Nuclear Antibody(ANA): NEGATIVE

## 2014-04-21 LAB — CK

## 2014-04-21 NOTE — Progress Notes (Signed)
[facility] PROGRESS NOTE  Benjamin Garrett AQT:[id number] DOB: 1986/06/21 DOA: 04/16/2014 PCP: No PCP Per Patient  Assessment/Plan: 1. 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 CK remains at >50000 (cut off for machine that measures CK). I suspect CK is trending down given clinical improvement 2. Renal function remains unchanged and normal 3. AST trending down - acetaminophen containing pain meds have already been d/c'd 4. Cont aggressive IVF 5. Cont to follow renal fx and serial CK trends 6. Given degree of CK, urine drug screen was checked and was pos for cannabis 7. Pt did admit to taking pre-workout supplement x 2 days before onset of sx 8. Lactate,, LDH, ESR normal 9. Pyruvate and carnitine pending 10. ANA neg 2. Elevated Liver Enzymes 1. Suspect secondary to presenting rhabdo 2. AST trending down 3. Liver US nl 3. DVT prophylaxis 1. Heparin subQ  Code Status: Full Family Communication: Pt in room Disposition Plan: Pending  Consultants:    Procedures:    Antibiotics:    HPI/Subjective: Continues to feel better. Very eager to leave hospital.   Objective: Filed Vitals:   04/20/14 1027 04/20/14 1326 04/20/14 2302 04/21/14 0548  BP:  128/76 139/82 133/75  Pulse:  74 76 61  Temp:  98 F (36.7 C) 98.2 F (36.8 C) 97.5 F (36.4 C)  TempSrc:  Oral Oral Oral  Resp:  '18 18 18  ' Height:      Weight: 84.8 kg (186 lb 15.2 oz)   85.6 kg (188 lb 11.4 oz)  SpO2:  100% 100% 100%    Intake/Output Summary (Last 24 hours) at 04/21/14 1140 Last data filed at 04/21/14 0920  Gross per 24 hour  Intake 27583.34 ml  Output   4225 ml  Net 23358.34 ml   Filed Weights   04/19/14 0610 04/20/14 1027 04/21/14 0548  Weight: 83.416 kg (183 lb 14.4 oz) 84.8 kg (186 lb 15.2 oz) 85.6 kg (188 lb 11.4 oz)    Exam:   General:  Awake, in nad  Cardiovascular: regular, s1, s2  Respiratory: normal resp effort, no wheezing  Abdomen: soft,  nondistended  Musculoskeletal: B upper extremity pain improving  Data Reviewed: Basic Metabolic Panel:  Recent Labs Lab 04/17/14 0429 04/18/14 0035 04/19/14 0012 04/20/14 0542 04/21/14 0534  NA 142 138 140 141 142  K 4.1 3.9 4.2 4.3 4.1  CL 106 102 102 102 104  CO2 '26 28 27 28 29  ' GLUCOSE 112* 87 92 94 82  BUN '12 7 7 6 6  ' CREATININE 1.01 0.99 0.85 0.84 0.90  CALCIUM 8.4 8.4 8.9 8.9 9.1   Liver Function Tests:  Recent Labs Lab 04/17/14 0429 04/18/14 0035 04/19/14 0012 04/20/14 0542 04/21/14 0534  AST 1161* 1715* 2145* 1915* 1411*  ALT 264* 424* 556* 597* 600*  ALKPHOS 62 66 69 71 76  BILITOT 0.5 0.6 0.5 0.6 0.4  PROT 5.8* 6.2 6.8 6.4 6.4  ALBUMIN 3.0* 3.1* 3.3* 3.2* 3.1*   No results found for this basename: LIPASE, AMYLASE,  in the last 168 hours No results found for this basename: AMMONIA,  in the last 168 hours CBC:  Recent Labs Lab 04/16/14 2114 04/17/14 0429  WBC 8.6 6.7  NEUTROABS 5.8  --   HGB 15.5 13.4  HCT 45.1 40.8  MCV 82.9 84.1  PLT 218 200   Cardiac Enzymes:  Recent Labs Lab 04/18/14 0920 04/18/14 1646 04/19/14 0012 04/20/14 0542 04/21/14 0534  CKTOTAL >50000* >50000* >50000* >50000* >50000*   BNP (last 3  results) No results found for this basename: PROBNP,  in the last 8760 hours CBG: No results found for this basename: GLUCAP,  in the last 168 hours  No results found for this or any previous visit (from the past 240 hour(s)).   Studies: US Abdomen Limited Ruq  04/21/14   CLINICAL DATA:  Elevated liver enzymes  EXAM: US ABDOMEN LIMITED - RIGHT UPPER QUADRANT  COMPARISON:  None.  FINDINGS: Gallbladder:  No gallstones or wall thickening visualized. No sonographic Murphy sign noted.  Common bile duct:  Diameter: 5 mm  Liver:  No focal lesion identified. Within normal limits in parenchymal echogenicity.  IMPRESSION: Normal exam.   Electronically Signed   By: Kerby Moors M.D.   On: 04-21-2014 14:20    Scheduled Meds: . heparin  subcutaneous  5,000 Units Subcutaneous 3 times per day   Continuous Infusions: . sodium chloride 250 mL/hr at 04/21/14 0920  . sodium chloride 250 mL/hr at 2014/04/21 2358    Active Problems:   Rhabdomyolysis  Time spent: 47mn  Stephen K Chiu  [facility] Pager [phone] If 7PM-7AM, please contact night-coverage at [email or web address], password [email or web address]5/18/2015, 11:40 AM  LOS: 5 days

## 2014-04-22 DIAGNOSIS — IMO0001 Reserved for inherently not codable concepts without codable children: Secondary | ICD-10-CM | POA: Diagnosis not present

## 2014-04-22 DIAGNOSIS — M6282 Rhabdomyolysis: Secondary | ICD-10-CM | POA: Diagnosis not present

## 2014-04-22 DIAGNOSIS — R748 Abnormal levels of other serum enzymes: Secondary | ICD-10-CM | POA: Diagnosis not present

## 2014-04-22 LAB — URINALYSIS, ROUTINE W REFLEX MICROSCOPIC
Bilirubin Urine: NEGATIVE
Bilirubin Urine: NEGATIVE
GLUCOSE, UA: NEGATIVE mg/dL
Glucose, UA: NEGATIVE mg/dL
HGB URINE DIPSTICK: NEGATIVE
HGB URINE DIPSTICK: NEGATIVE
KETONES UR: NEGATIVE mg/dL
Ketones, ur: NEGATIVE mg/dL
Leukocytes, UA: NEGATIVE
Leukocytes, UA: NEGATIVE
Nitrite: NEGATIVE
Nitrite: NEGATIVE
PROTEIN: NEGATIVE mg/dL
Protein, ur: NEGATIVE mg/dL
Specific Gravity, Urine: 1.01 (ref 1.005–1.030)
Specific Gravity, Urine: 1.008 (ref 1.005–1.030)
Urobilinogen, UA: 0.2 mg/dL (ref 0.0–1.0)
Urobilinogen, UA: 1 mg/dL (ref 0.0–1.0)
pH: 6.5 (ref 5.0–8.0)
pH: 8 (ref 5.0–8.0)

## 2014-04-22 LAB — PYRUVIC ACID: PYRUVIC ACID, BLOOD: 0.73 mg/dL (ref 0.30–1.50)

## 2014-04-22 LAB — COMPREHENSIVE METABOLIC PANEL
ALT: 594 U/L — AB (ref 0–53)
AST: 921 U/L — AB (ref 0–37)
Albumin: 3.2 g/dL — ABNORMAL LOW (ref 3.5–5.2)
Alkaline Phosphatase: 71 U/L (ref 39–117)
BILIRUBIN TOTAL: 0.4 mg/dL (ref 0.3–1.2)
BUN: 5 mg/dL — ABNORMAL LOW (ref 6–23)
CHLORIDE: 104 meq/L (ref 96–112)
CO2: 26 meq/L (ref 19–32)
Calcium: 9.1 mg/dL (ref 8.4–10.5)
Creatinine, Ser: 0.85 mg/dL (ref 0.50–1.35)
GFR calc Af Amer: >90 mL/min (ref >90)
Glucose, Bld: 92 mg/dL (ref 70–99)
Potassium: 3.7 mEq/L (ref 3.7–5.3)
SODIUM: 141 meq/L (ref 137–147)
Total Protein: 6.3 g/dL (ref 6.0–8.3)

## 2014-04-22 LAB — CK: Total CK: 33650 U/L — ABNORMAL HIGH (ref 7–232)

## 2014-04-22 NOTE — Progress Notes (Signed)
[facility] PROGRESS NOTE  Benjamin Garrett YVO:[id number] DOB: 16-Apr-1986 DOA: 04/16/2014 PCP: No PCP Per Patient  Assessment/Plan: 1. 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 CK now trending down to 33k from >50k 2. Renal function remains unchanged and normal 3. LFT's overall are improving 4. Cont aggressive IVF 5. Cont to follow renal fx and serial CK trends 6. Given degree of CK, urine drug screen was checked and was pos for cannabis 7. Pt did admit to taking pre-workout supplement x 2 days before onset of sx 8. Lactate, LDH, ESR normal 9. Pyruvate and carnitine pending 10. ANA neg 11. Strongly suspect exercise induced rhabdo 2. Elevated Liver Enzymes 1. Suspect secondary to presenting rhabdo 2. Levels trending down 3. Liver US nl 3. DVT prophylaxis 1. Heparin subQ  Code Status: Full Family Communication: Pt in room Disposition Plan: Pending  Consultants:    Procedures:    Antibiotics:    HPI/Subjective: Feels better. No acute events noted overnight   Objective: Filed Vitals:   04/21/14 0548 04/21/14 1500 04/21/14 2146 04/22/14 0604  BP: 133/75 139/76 137/97 147/68  Pulse: 61 76 63 67  Temp: 97.5 F (36.4 C) 97.1 F (36.2 C) 98.3 F (36.8 C) 98 F (36.7 C)  TempSrc: Oral Oral Oral Oral  Resp: '18 18 19 19  ' Height:      Weight: 85.6 kg (188 lb 11.4 oz)   84.8 kg (186 lb 15.2 oz)  SpO2: 100% 100% 99% 96%    Intake/Output Summary (Last 24 hours) at 04/22/14 1053 Last data filed at 04/22/14 1000  Gross per 24 hour  Intake   2294 ml  Output   4600 ml  Net  -2306 ml   Filed Weights   04/20/14 1027 04/21/14 0548 04/22/14 0604  Weight: 84.8 kg (186 lb 15.2 oz) 85.6 kg (188 lb 11.4 oz) 84.8 kg (186 lb 15.2 oz)    Exam:   General:  Awake, in nad  Cardiovascular: regular, s1, s2  Respiratory: normal resp effort, no wheezing  Abdomen: soft, nondistended  Musculoskeletal: B upper extremity pain improving  Data  Reviewed: Basic Metabolic Panel:  Recent Labs Lab 04/18/14 0035 04/19/14 0012 04/20/14 0542 04/21/14 0534 04/22/14 0621  NA 138 140 141 142 141  K 3.9 4.2 4.3 4.1 3.7  CL 102 102 102 104 104  CO2 '28 27 28 29 26  ' GLUCOSE 87 92 94 82 92  BUN '7 7 6 6 ' 5*  CREATININE 0.99 0.85 0.84 0.90 0.85  CALCIUM 8.4 8.9 8.9 9.1 9.1   Liver Function Tests:  Recent Labs Lab 04/18/14 0035 04/19/14 0012 04/20/14 0542 04/21/14 0534 04/22/14 0621  AST 1715* 2145* 1915* 1411* 921*  ALT 424* 556* 597* 600* 594*  ALKPHOS 66 69 71 76 71  BILITOT 0.6 0.5 0.6 0.4 0.4  PROT 6.2 6.8 6.4 6.4 6.3  ALBUMIN 3.1* 3.3* 3.2* 3.1* 3.2*   No results found for this basename: LIPASE, AMYLASE,  in the last 168 hours No results found for this basename: AMMONIA,  in the last 168 hours CBC:  Recent Labs Lab 04/16/14 2114 04/17/14 0429  WBC 8.6 6.7  NEUTROABS 5.8  --   HGB 15.5 13.4  HCT 45.1 40.8  MCV 82.9 84.1  PLT 218 200   Cardiac Enzymes:  Recent Labs Lab 04/18/14 1646 04/19/14 0012 04/20/14 0542 04/21/14 0534 04/22/14 0621  CKTOTAL >50000* >50000* >50000* >50000* 33650*   BNP (last 3 results) No results found for this basename: PROBNP,  in  the last 8760 hours CBG: No results found for this basename: GLUCAP,  in the last 168 hours  No results found for this or any previous visit (from the past 240 hour(s)).   Studies: No results found.  Scheduled Meds: . heparin subcutaneous  5,000 Units Subcutaneous 3 times per day   Continuous Infusions: . sodium chloride 250 mL/hr at 04/22/14 0619  . sodium chloride 250 mL/hr at 04/22/14 0130    Active Problems:   Rhabdomyolysis  Time spent: 49mn  Adiel Erney K Whitney Hillegass  [facility] Pager [phone] If 7PM-7AM, please contact night-coverage at [email or web address], password [email or web address]5/19/2015, 10:53 AM  LOS: 6 days

## 2014-04-23 DIAGNOSIS — R748 Abnormal levels of other serum enzymes: Secondary | ICD-10-CM | POA: Diagnosis not present

## 2014-04-23 DIAGNOSIS — IMO0001 Reserved for inherently not codable concepts without codable children: Secondary | ICD-10-CM | POA: Diagnosis not present

## 2014-04-23 DIAGNOSIS — M6282 Rhabdomyolysis: Secondary | ICD-10-CM | POA: Diagnosis not present

## 2014-04-23 LAB — COMPREHENSIVE METABOLIC PANEL
ALBUMIN: 3.1 g/dL — AB (ref 3.5–5.2)
ALK PHOS: 72 U/L (ref 39–117)
ALT: 554 U/L — ABNORMAL HIGH (ref 0–53)
AST: 579 U/L — ABNORMAL HIGH (ref 0–37)
BUN: 6 mg/dL (ref 6–23)
CO2: 26 mEq/L (ref 19–32)
Calcium: 8.9 mg/dL (ref 8.4–10.5)
Chloride: 105 mEq/L (ref 96–112)
Creatinine, Ser: 0.88 mg/dL (ref 0.50–1.35)
GFR calc non Af Amer: >90 mL/min (ref >90)
GLUCOSE: 91 mg/dL (ref 70–99)
Potassium: 3.5 mEq/L — ABNORMAL LOW (ref 3.7–5.3)
Sodium: 142 mEq/L (ref 137–147)
Total Bilirubin: 0.5 mg/dL (ref 0.3–1.2)
Total Protein: 6.2 g/dL (ref 6.0–8.3)

## 2014-04-23 LAB — CK: Total CK: 16752 U/L — ABNORMAL HIGH (ref 7–232)

## 2014-04-23 LAB — MYOGLOBIN, SERUM: MYOGLOBIN: 18 ng/mL (ref <111)

## 2014-04-23 MED ORDER — POTASSIUM CHLORIDE CRYS ER 20 MEQ PO TBCR
40.0000 meq | EXTENDED_RELEASE_TABLET | Freq: Two times a day (BID) | ORAL | Status: DC
  Administered 2014-04-23: 40 meq via ORAL
  Filled 2014-04-23: qty 2

## 2014-04-23 NOTE — Progress Notes (Signed)
AVS was given and went over with patient. Patient stated that he did not have any questions. Patient was able to ambulate to his transportation.

## 2014-04-23 NOTE — Discharge Summary (Signed)
Physician Discharge Summary  Benjamin Garrett HMC:[id number] DOB: 12/25/1985 DOA: 04/16/2014  PCP: No PCP Per Patient  Admit date: 04/16/2014 Discharge date: 04/23/2014  Time spent: 45 minutes  Recommendations for Outpatient Follow-up:  Patient will be discharged to home. He was given a list of physicians to contact, to establish care with followup with. Patient should refrain from using illicit drugs.  Discharge Diagnoses:  Exercise-induced rhabdomyolysis Elevated liver enzymes  Discharge Condition: Stable  Diet recommendation: Regular  Filed Weights   04/21/14 0548 04/22/14 0604 04/23/14 6629  Weight: 85.6 kg (188 lb 11.4 oz) 84.8 kg (186 lb 15.2 oz) 83.054 kg (183 lb 1.6 oz)    History of present illness:  This is a 28 y.o. year old male with bo significant medical history presenting with exercise induced rhabdomyolysis. Patient stated that he had not exercised in over 6 months. Stated that he did a very strenuous upper body work on Monday. Patient stated that he felt otherwise fine on Tuesday with general soreness, but woke up the morning of admission and felt like he had been "hit by a bus". Patient went to work throughout the day. Works a Designer, [employer]. Did not urinate during the day. Patient urinated when he went home and it was dark brown. Patient denies any illicit drug.  Has not performed any other strenous activity apart from exercise on Monday.  Presented to [facility] w/ CK > 50k. LFTs also elevated. AST 1193, ALT 273. Cr 1.1. UA with large blood. Received 3L NS at [facility] prior to transfer.   Hospital Course:  Exercise-induced rhabdomyolysis with suspected hepatic injury -Admission, CPK levels were over 50,000 currently-the downward to 16,000 -Patient was placed on IV fluids aggressively -LFTs have also trended downward -Renal function remains unchanged and normal -Drug screen was positive for cannabis -Lactate, LDH, ESR normal, negative ANA -This is likely  exercise-induced  Elevated liver enzymes -Possibly secondary to rhabdomyolysis -Ultrasound was normal -Enzymes have been trending downward  Drug abuse -Patient was counseled  Procedures: None  Consultations: None  Discharge Exam: Filed Vitals:   04/23/14 0613  BP: 142/78  Pulse: 65  Temp: 97.8 F (36.6 C)  Resp: 17     General: Well developed, well nourished, NAD, appears stated age  HEENT: NCAT, PERRLA, EOMI, Anicteic Sclera, mucous membranes moist.  Neck: Supple, no JVD, no masses  Cardiovascular: S1 S2 auscultated, no rubs, murmurs or gallops. Regular rate and rhythm.  Respiratory: Clear to auscultation bilaterally with equal chest rise  Abdomen: Soft, nontender, nondistended, + bowel sounds  Extremities: warm dry without cyanosis clubbing or edema  Neuro: AAOx3, cranial nerves grossly intact. Strength 5/5 in patient's upper and lower extremities bilaterally  Skin: Without rashes exudates or nodules  Psych: Normal affect and demeanor with intact judgement and insight  Discharge Instructions      Discharge Instructions   Discharge instructions    Complete by:  As directed   Patient will be discharged to home. He was given a list of physicians to contact, to establish care with followup with.     Increase activity slowly    Complete by:  As directed             Medication List         multivitamin with minerals Tabs tablet  Take 1 tablet by mouth daily.       No Known Allergies Follow-up Information   Follow up with Primary care physician. Schedule an appointment as soon as possible for a visit  in 1 week. (Establish care in hospital followup)        The results of significant diagnostics from this hospitalization (including imaging, microbiology, ancillary and laboratory) are listed below for reference.    Significant Diagnostic Studies: US Abdomen Limited Ruq  05-17-2014   CLINICAL DATA:  Elevated liver enzymes  EXAM: US ABDOMEN  LIMITED - RIGHT UPPER QUADRANT  COMPARISON:  None.  FINDINGS: Gallbladder:  No gallstones or wall thickening visualized. No sonographic Murphy sign noted.  Common bile duct:  Diameter: 5 mm  Liver:  No focal lesion identified. Within normal limits in parenchymal echogenicity.  IMPRESSION: Normal exam.   Electronically Signed   By: Kerby Moors M.D.   On: 05/17/2014 14:20    Microbiology: No results found for this or any previous visit (from the past 240 hour(s)).   Labs: Basic Metabolic Panel:  Recent Labs Lab 17-May-2014 0012 04/20/14 0542 04/21/14 0534 04/22/14 0621 04/23/14 0529  NA 140 141 142 141 142  K 4.2 4.3 4.1 3.7 3.5*  CL 102 102 104 104 105  CO2 '27 28 29 26 26  ' GLUCOSE 92 94 82 92 91  BUN '7 6 6 ' 5* 6  CREATININE 0.85 0.84 0.90 0.85 0.88  CALCIUM 8.9 8.9 9.1 9.1 8.9   Liver Function Tests:  Recent Labs Lab May 17, 2014 0012 04/20/14 0542 04/21/14 0534 04/22/14 0621 04/23/14 0529  AST 2145* 1915* 1411* 921* 579*  ALT 556* 597* 600* 594* 554*  ALKPHOS 69 71 76 71 72  BILITOT 0.5 0.6 0.4 0.4 0.5  PROT 6.8 6.4 6.4 6.3 6.2  ALBUMIN 3.3* 3.2* 3.1* 3.2* 3.1*   No results found for this basename: LIPASE, AMYLASE,  in the last 168 hours No results found for this basename: AMMONIA,  in the last 168 hours CBC:  Recent Labs Lab 04/16/14 2114 04/17/14 0429  WBC 8.6 6.7  NEUTROABS 5.8  --   HGB 15.5 13.4  HCT 45.1 40.8  MCV 82.9 84.1  PLT 218 200   Cardiac Enzymes:  Recent Labs Lab 2014/05/17 0012 04/20/14 0542 04/21/14 0534 04/22/14 0621 04/23/14 0529  CKTOTAL >50000* >50000* >50000* 33650* 66063*   BNP: BNP (last 3 results) No results found for this basename: PROBNP,  in the last 8760 hours CBG: No results found for this basename: GLUCAP,  in the last 168 hours     Signed:  Cristal Ford  [facility] 04/23/2014, 12:08 PM

## 2014-04-23 NOTE — Care Management Note (Signed)
  Page 1 of 1   04/23/2014     12:41:23 PM CARE MANAGEMENT NOTE 04/23/2014  Patient:  Renato GailsXXXWRIGHT,Hagan   Account Number:  [id number]  Date Initiated:  04/22/2014  Documentation initiated by:  Ronny FlurryWILE,Rosellen Lichtenberger  Subjective/Objective Assessment:     Action/Plan:   Anticipated DC Date:     Anticipated DC Plan:  HOME/SELF CARE      DC Planning Services  CM consult      Choice offered to / List presented to:             Status of service:   Medicare Important Message given?   (If response is "NO", the following Medicare IM given date fields will be blank) Date Medicare IM given:   Date Additional Medicare IM given:    Discharge Disposition:    Per UR Regulation:    If discussed at Long Length of Stay Meetings, dates discussed:   04/22/2014    Comments:  04-23-14 Consult for PCP . Confirmed with patient he does have BCBS, explained he can call the number on insurance card and be provided a list of PCP 's in network with [employer]. Patient stated he will do this . Ronny FlurryHeather Tahni Porchia RN BSN

## 2014-04-23 NOTE — Discharge Instructions (Signed)
Rhabdomyolysis °Rhabdomyolysis is the breakdown of muscle fibers due to injury. The injury may come from physical damage to the muscle like an injury but other causes are: °· High fever (hyperthermia). °· Seizures (convulsions). °· Low phosphate levels. °· Diseases of metabolism. °· Heatstroke. °· Drug toxicity. °· Over exertion. °· Alcoholism. °· Muscle is cut off from oxygen (anoxia). °· The squeezing of nerves and blood vessels (compartment syndrome). °Some drugs which may cause the breakdown of muscle are: °· Antibiotics. °· Statins. °· Alcohol. °· Animal toxins. °Myoglobin is a substance which helps muscle use oxygen. When the muscle is damaged, the myoglobin is released into the bloodstream. It is filtered out of the bloodstream by the kidneys. Myoglobin may block up the kidneys. This may cause damage, such as kidney failure. It also breaks down into other damaging toxic parts, which also cause kidney failure.  °SYMPTOMS  °· Dark, red, or tea colored urine. °· Weakness of affected muscles. °· Weight gain from water retention. °· Joint aches and pains. °· Irregular heart from high potassium in the blood. °· Muscle tenderness or aching. °· Generalized weakness. °· Seizures. °· Feeling tired (fatigue). °DIAGNOSIS  °Your caregiver may find muscle tenderness on exam and suspect the problem. Urine tests and blood work can confirm the problem. °TREATMENT     °· Early and aggressive treatment with large amounts of fluids may help prevent kidney failure. °· Water producing medicine (diuretic) may be used to help flush the kidneys. °· High potassium and calcium problems (electrolyte) in your blood may need treatment. °HOME CARE INSTRUCTIONS  °This problem is usually cared for in a hospital. If you are allowed to go home and require dialysis, make sure you keep all appointments for lab work and dialysis. Not doing so could result in death. °Document Released: 11/03/2004 Document Revised: 02/13/2012 Document Reviewed:  05/18/2009 °ExitCare® Patient Information ©2014 [employer]. ° °

## 2014-04-25 LAB — MYOGLOBIN, URINE: Myoglobin, Ur: <27 mcg/L (ref <28)

## 2014-04-29 LAB — CARNITINE

## 2015-04-13 ENCOUNTER — Ambulatory Visit
Admission: RE | Admit: 2015-04-13 | Discharge: 2015-04-13 | Disposition: A | Discharge status: Home / Self Care | Payer: BLUE CROSS/BLUE SHIELD | Source: Ambulatory Visit | Attending: Family Medicine | Admitting: Family Medicine

## 2015-04-13 ENCOUNTER — Other Ambulatory Visit: Payer: Self-pay | Admitting: Family Medicine

## 2015-04-13 DIAGNOSIS — R0989 Other specified symptoms and signs involving the circulatory and respiratory systems: Secondary | ICD-10-CM

## 2016-04-25 DIAGNOSIS — L700 Acne vulgaris: Secondary | ICD-10-CM | POA: Diagnosis not present

## 2016-04-25 DIAGNOSIS — D290 Benign neoplasm of penis: Secondary | ICD-10-CM | POA: Diagnosis not present

## 2016-04-25 DIAGNOSIS — D485 Neoplasm of uncertain behavior of skin: Secondary | ICD-10-CM | POA: Diagnosis not present

## 2016-08-05 DIAGNOSIS — F419 Anxiety disorder, unspecified: Secondary | ICD-10-CM | POA: Diagnosis not present

## 2016-10-16 IMAGING — CR DG CHEST 2V
2 series · 2 of 2 positions shown · non-contrast
Comparison: August 02, 2014

CLINICAL DATA: One week history of upper respiratory tract
infection. Fever. Cough.

EXAM:
CHEST  2 VIEW

[w chest pa]
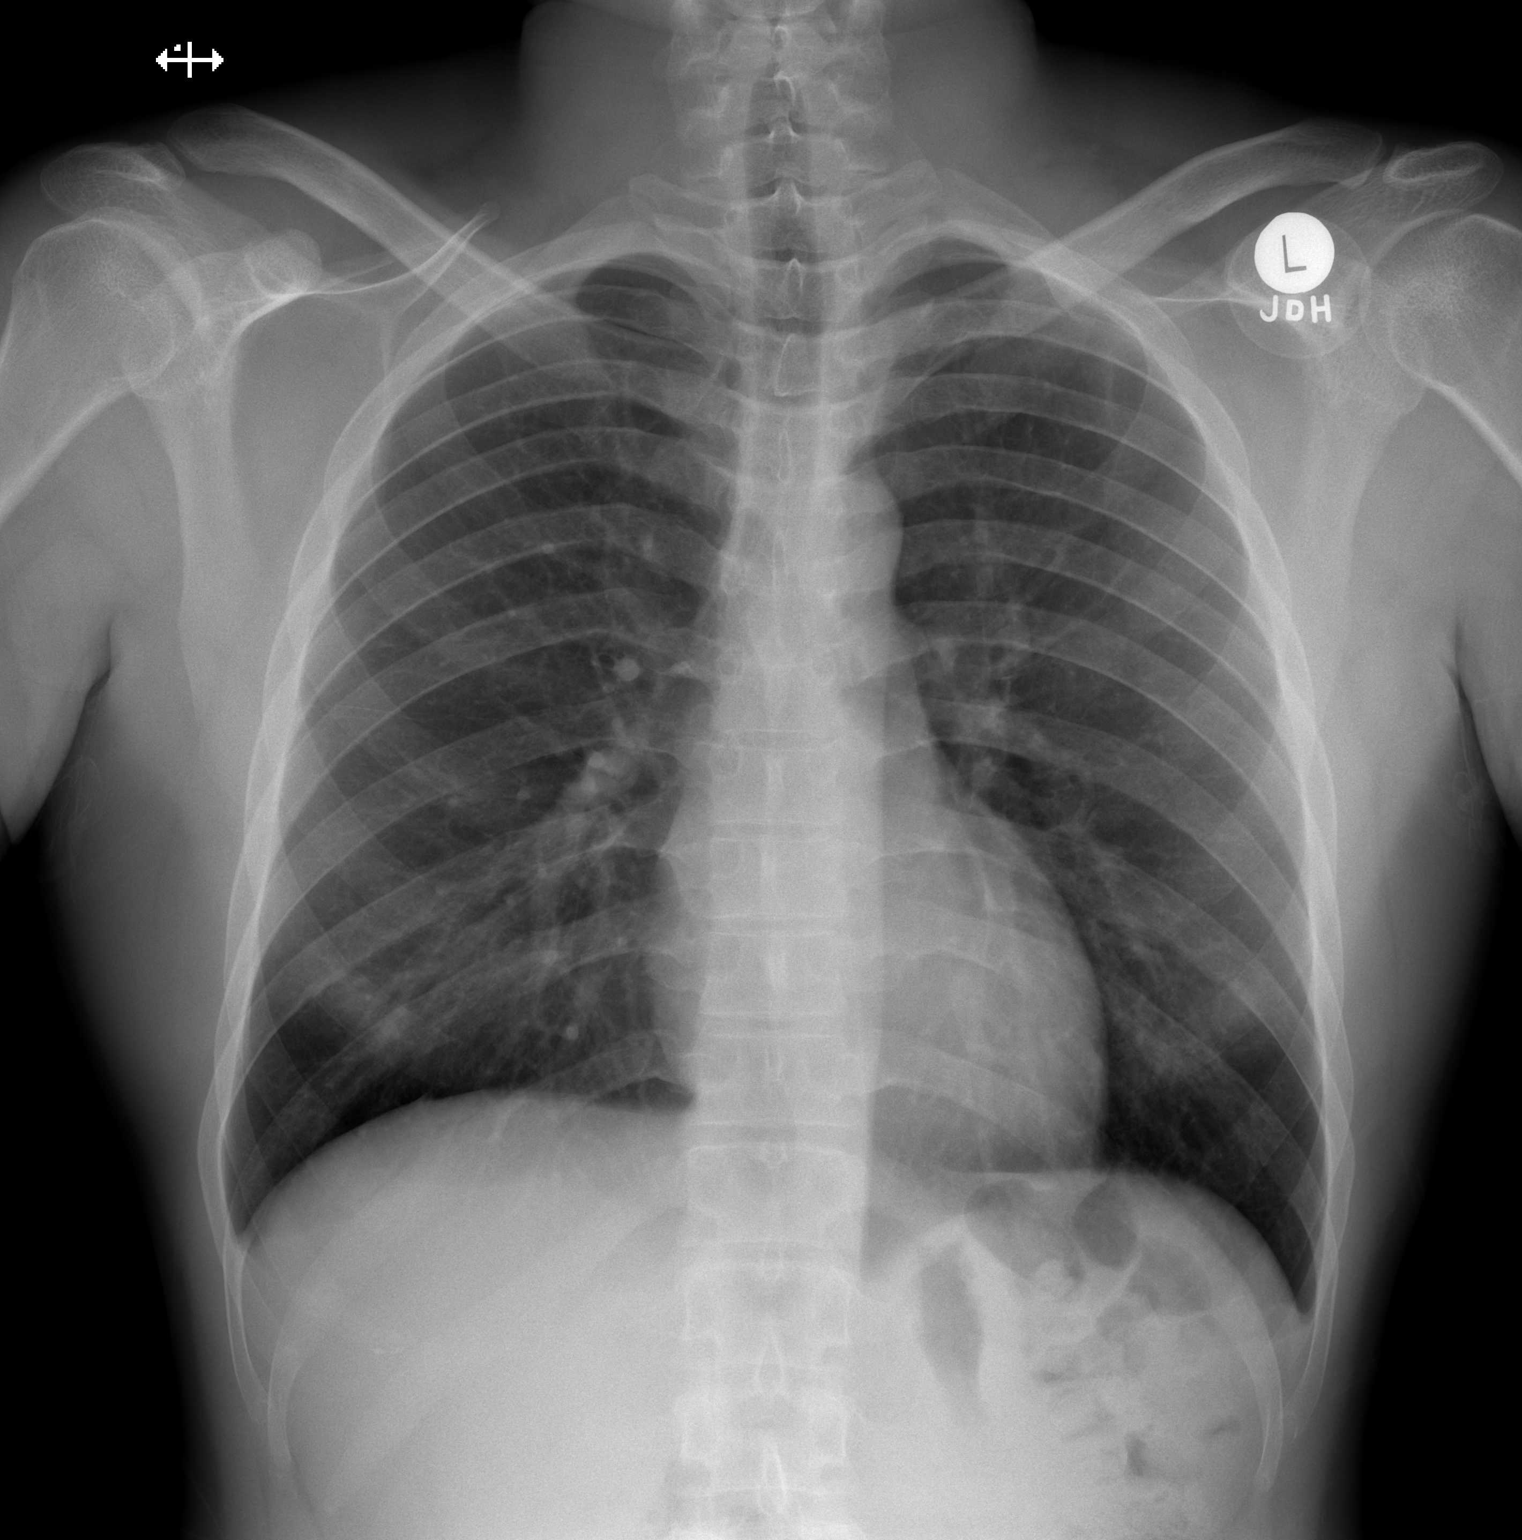

[w chest lat]
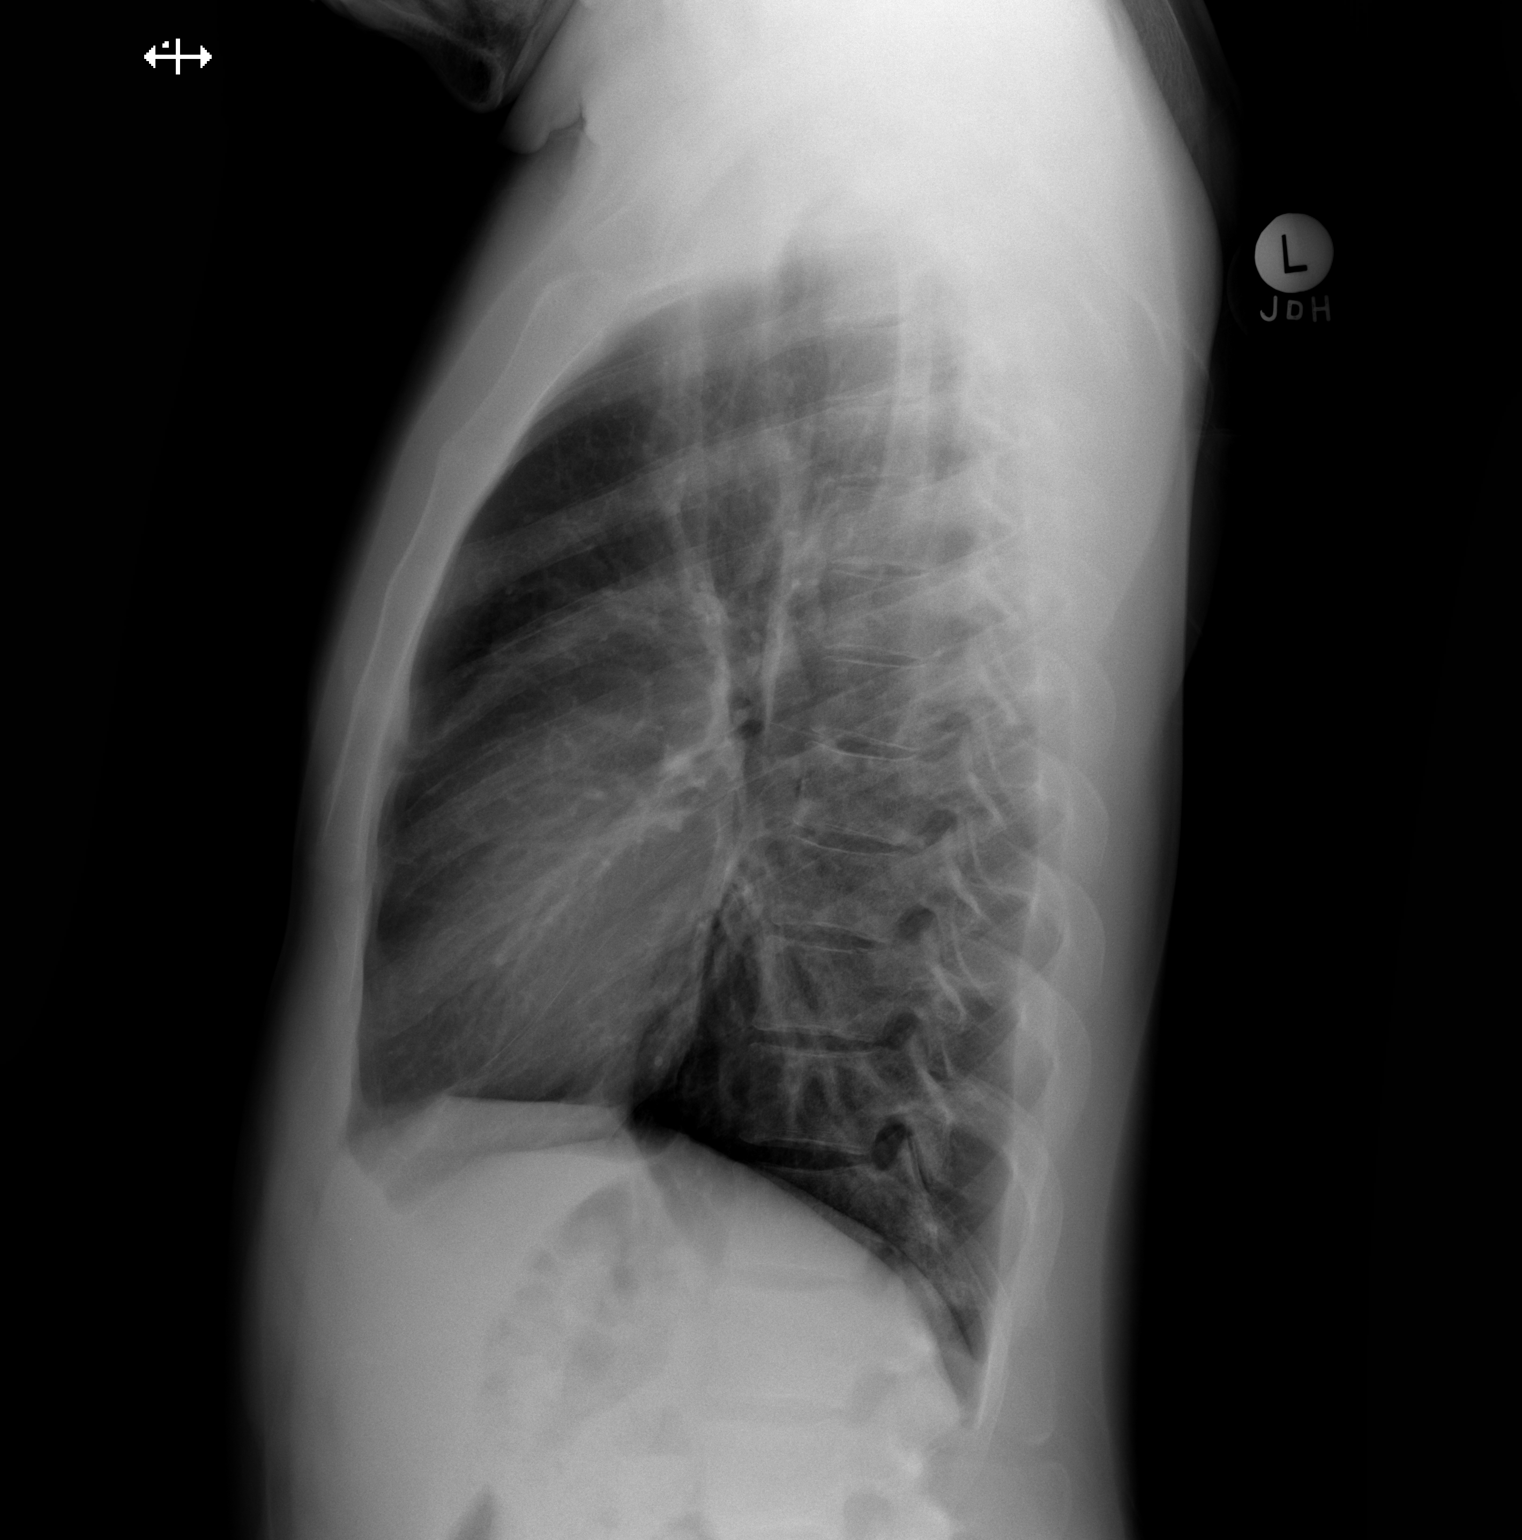

[2 of 2 positions shown; findings below may reference images not displayed]

FINDINGS: Lungs are clear. Heart size and pulmonary vascularity are normal. No
adenopathy. No bone lesions.
IMPRESSION: No edema or consolidation.

## 2016-12-14 DIAGNOSIS — S0511XA Contusion of eyeball and orbital tissues, right eye, initial encounter: Secondary | ICD-10-CM | POA: Diagnosis not present

## 2016-12-14 DIAGNOSIS — S91202A Unspecified open wound of left great toe with damage to nail, initial encounter: Secondary | ICD-10-CM | POA: Diagnosis not present

## 2016-12-14 DIAGNOSIS — S5002XA Contusion of left elbow, initial encounter: Secondary | ICD-10-CM | POA: Diagnosis not present

## 2017-08-03 ENCOUNTER — Encounter: Payer: Self-pay | Admitting: Physician Assistant

## 2017-08-03 ENCOUNTER — Ambulatory Visit (INDEPENDENT_AMBULATORY_CARE_PROVIDER_SITE_OTHER): Payer: BLUE CROSS/BLUE SHIELD | Admitting: Physician Assistant

## 2017-08-03 VITALS — BP 120/78 | HR 73 | Temp 98.5°F | Resp 16 | Ht 68.5 in | Wt 174.0 lb

## 2017-08-03 DIAGNOSIS — R300 Dysuria: Secondary | ICD-10-CM

## 2017-08-03 DIAGNOSIS — Z202 Contact with and (suspected) exposure to infections with a predominantly sexual mode of transmission: Secondary | ICD-10-CM

## 2017-08-03 DIAGNOSIS — Z8739 Personal history of other diseases of the musculoskeletal system and connective tissue: Secondary | ICD-10-CM

## 2017-08-03 LAB — POCT URINALYSIS DIP (MANUAL ENTRY)
Bilirubin, UA: NEGATIVE
Glucose, UA: NEGATIVE mg/dL
Ketones, POC UA: NEGATIVE mg/dL
Leukocytes, UA: NEGATIVE
NITRITE UA: NEGATIVE
PH UA: 7 (ref 5.0–8.0)
Protein Ur, POC: NEGATIVE mg/dL
RBC UA: NEGATIVE
SPEC GRAV UA: 1.02 (ref 1.010–1.025)
UROBILINOGEN UA: 0.2 U/dL

## 2017-08-03 MED ORDER — AZITHROMYCIN 250 MG PO TABS: ORAL_TABLET | ORAL | 0 refills | Status: AC

## 2017-08-03 MED ORDER — CEFTRIAXONE SODIUM 250 MG IJ SOLR
250.0000 mg | Freq: Once | INTRAMUSCULAR | Status: AC
  Administered 2017-08-03: 250 mg via INTRAMUSCULAR

## 2017-08-03 MED ORDER — AZITHROMYCIN 250 MG PO TABS: ORAL_TABLET | ORAL | 0 refills | Status: DC

## 2017-08-03 NOTE — Progress Notes (Signed)
08/03/2017 2:50 PM   DOB: Mar 21, 1986 / MRN: [id number]  SUBJECTIVE:  Benjamin Garrett is a 31 y.o. male presenting for exposure to chlamydia last week.  Tells me he has pruritis at the tip of his penis.  Denies dysuria, urgency. He would like to be screened for RPR, HIV and trichomas.   Would like his kidneys checked.  Tells me that he tried some LEAN and this caused him to have rhabdomyolysis and he was admitted to the hospital for 7 days for this and tells me he made a full recovery.    He has No Known Allergies.   He  has no past medical history on file.    He  reports that he has never smoked. He has never used smokeless tobacco. He reports that he drinks alcohol. He reports that he uses drugs, including Marijuana. He  reports that he currently engages in sexual activity. The patient  has no past surgical history on file.  His family history includes Cancer in his maternal grandmother and paternal grandmother; Hyperlipidemia in his mother; Hypertension in his mother.  Review of Systems  Constitutional: Negative for chills, diaphoresis and fever.  Respiratory: Negative for cough, hemoptysis, sputum production, shortness of breath and wheezing.   Cardiovascular: Negative for chest pain, orthopnea and leg swelling.  Gastrointestinal: Negative for abdominal pain, blood in stool, constipation, diarrhea, heartburn, melena, nausea and vomiting.  Genitourinary: Positive for dysuria. Negative for flank pain, frequency, hematuria and urgency.  Skin: Negative for rash.  Neurological: Negative for dizziness.    The problem list and medications were reviewed and updated by myself where necessary and exist elsewhere in the encounter.   OBJECTIVE:  BP 120/78   Pulse 73   Temp 98.5 F (36.9 C) (Oral)   Resp 16   Ht 5' 8.5" (1.74 m)   Wt 174 lb (78.9 kg)   SpO2 98%   BMI 26.07 kg/m   Physical Exam  Constitutional: He is oriented to person, place, and time. He appears well-developed. He  is active and cooperative.  Non-toxic appearance.  Eyes: Pupils are equal, round, and reactive to light. EOM are normal.  Cardiovascular: Normal rate, regular rhythm, S1 normal, S2 normal, normal heart sounds, intact distal pulses and normal pulses.  Exam reveals no gallop and no friction rub.   No murmur heard. Pulmonary/Chest: Effort normal. No stridor. No tachypnea. No respiratory distress. He has no wheezes. He has no rales.  Abdominal: He exhibits no distension.  Genitourinary: Penis normal. No penile tenderness.  Musculoskeletal: He exhibits no edema.  Neurological: He is alert and oriented to person, place, and time. He has normal strength and normal reflexes. He is not disoriented. No cranial nerve deficit or sensory deficit. He exhibits normal muscle tone. Coordination and gait normal.  Skin: Skin is warm and dry. He is not diaphoretic. No pallor.  Psychiatric: His behavior is normal.  Vitals reviewed.   No results found for this or any previous visit (from the past 72 hour(s)).  No results found.  ASSESSMENT AND PLAN:  Shaylon was seen today for establish care.  Diagnoses and all orders for this visit:  Dysuria -     HIV antibody -     Trichomonas vaginalis, RNA -     RPR  Exposure to chlamydia -     cefTRIAXone (ROCEPHIN) injection 250 mg; Inject 250 mg into the muscle once. -     azithromycin (ZITHROMAX) 250 MG tablet; Take 4 tablets once.  History of rhabdomyolysis -     CMP14+EGFR -     CBC -     POCT urinalysis dipstick    The patient is advised to call or return to clinic if he does not see an improvement in symptoms, or to seek the care of the closest emergency department if he worsens with the above plan.   Philis Fendt, MHS, PA-C [facility] 08/03/2017 2:50 PM

## 2017-08-04 LAB — CBC
HEMATOCRIT: 44.5 % (ref 37.5–51.0)
HEMOGLOBIN: 14.5 g/dL (ref 13.0–17.7)
MCH: 27.9 pg (ref 26.6–33.0)
MCHC: 32.6 g/dL (ref 31.5–35.7)
MCV: 86 fL (ref 79–97)
Platelets: 213 10*3/uL (ref 150–379)
RBC: 5.2 x10E6/uL (ref 4.14–5.80)
RDW: 14.9 % (ref 12.3–15.4)
WBC: 5.9 10*3/uL (ref 3.4–10.8)

## 2017-08-04 LAB — RPR: RPR: NONREACTIVE

## 2017-08-04 LAB — CMP14+EGFR
A/G RATIO: 1.5 (ref 1.2–2.2)
ALK PHOS: 72 IU/L (ref 39–117)
ALT: 22 IU/L (ref 0–44)
AST: 25 IU/L (ref 0–40)
Albumin: 4.7 g/dL (ref 3.5–5.5)
BUN/Creatinine Ratio: 10 (ref 9–20)
BUN: 12 mg/dL (ref 6–20)
Bilirubin Total: 0.6 mg/dL (ref 0.0–1.2)
CHLORIDE: 98 mmol/L (ref 96–106)
CO2: 24 mmol/L (ref 20–29)
Calcium: 9.6 mg/dL (ref 8.7–10.2)
Creatinine, Ser: 1.22 mg/dL (ref 0.76–1.27)
GFR calc Af Amer: 91 mL/min/{1.73_m2} (ref >59)
GFR calc non Af Amer: 79 mL/min/{1.73_m2} (ref >59)
GLUCOSE: 100 mg/dL — AB (ref 65–99)
Globulin, Total: 3.2 g/dL (ref 1.5–4.5)
Potassium: 4.3 mmol/L (ref 3.5–5.2)
Sodium: 139 mmol/L (ref 134–144)
Total Protein: 7.9 g/dL (ref 6.0–8.5)

## 2017-08-04 LAB — HIV ANTIBODY (ROUTINE TESTING W REFLEX): HIV Screen 4th Generation wRfx: NONREACTIVE

## 2017-08-05 LAB — TRICHOMONAS VAGINALIS, PROBE AMP: TRICH VAG BY NAA: NEGATIVE

## 2017-08-16 DIAGNOSIS — L700 Acne vulgaris: Secondary | ICD-10-CM | POA: Diagnosis not present

## 2018-02-01 DIAGNOSIS — Z0000 Encounter for general adult medical examination without abnormal findings: Secondary | ICD-10-CM | POA: Diagnosis not present

## 2018-05-18 DIAGNOSIS — L700 Acne vulgaris: Secondary | ICD-10-CM | POA: Diagnosis not present
